# Patient Record
Sex: Female | Born: 1970 | Hispanic: Yes | Marital: Married | Smoking: Current every day smoker
Health system: Southern US, Community
[De-identification: ages and names within clinical notes are randomized; demographics above are authoritative.]

---

## 2007-01-05 ENCOUNTER — Emergency Department: Payer: Self-pay | Admitting: Internal Medicine

## 2007-12-27 ENCOUNTER — Inpatient Hospital Stay: Payer: Self-pay | Admitting: Surgery

## 2007-12-27 ENCOUNTER — Other Ambulatory Visit: Payer: Self-pay

## 2008-06-05 ENCOUNTER — Emergency Department: Payer: Self-pay | Admitting: Internal Medicine

## 2009-10-21 ENCOUNTER — Emergency Department: Payer: Self-pay | Admitting: Emergency Medicine

## 2009-10-22 ENCOUNTER — Emergency Department: Payer: Self-pay | Admitting: Emergency Medicine

## 2010-03-18 ENCOUNTER — Emergency Department: Payer: Self-pay | Admitting: Unknown Physician Specialty

## 2010-09-09 ENCOUNTER — Emergency Department: Payer: Self-pay | Admitting: Emergency Medicine

## 2011-06-13 ENCOUNTER — Emergency Department: Payer: Self-pay | Admitting: Emergency Medicine

## 2018-12-16 ENCOUNTER — Emergency Department: Payer: No Typology Code available for payment source

## 2018-12-16 ENCOUNTER — Emergency Department
Admission: EM | Admit: 2018-12-16 | Discharge: 2018-12-16 | Disposition: A | Discharge status: Home / Self Care | Payer: No Typology Code available for payment source | Attending: Emergency Medicine | Admitting: Emergency Medicine

## 2018-12-16 ENCOUNTER — Other Ambulatory Visit: Payer: Self-pay

## 2018-12-16 DIAGNOSIS — Y9389 Activity, other specified: Secondary | ICD-10-CM | POA: Insufficient documentation

## 2018-12-16 DIAGNOSIS — M25512 Pain in left shoulder: Secondary | ICD-10-CM | POA: Diagnosis not present

## 2018-12-16 DIAGNOSIS — Y92413 State road as the place of occurrence of the external cause: Secondary | ICD-10-CM | POA: Diagnosis not present

## 2018-12-16 DIAGNOSIS — Y998 Other external cause status: Secondary | ICD-10-CM | POA: Insufficient documentation

## 2018-12-16 DIAGNOSIS — S161XXA Strain of muscle, fascia and tendon at neck level, initial encounter: Secondary | ICD-10-CM | POA: Diagnosis not present

## 2018-12-16 DIAGNOSIS — M25572 Pain in left ankle and joints of left foot: Secondary | ICD-10-CM | POA: Diagnosis not present

## 2018-12-16 DIAGNOSIS — R51 Headache: Secondary | ICD-10-CM | POA: Diagnosis not present

## 2018-12-16 DIAGNOSIS — F17210 Nicotine dependence, cigarettes, uncomplicated: Secondary | ICD-10-CM | POA: Diagnosis not present

## 2018-12-16 DIAGNOSIS — M545 Low back pain: Secondary | ICD-10-CM | POA: Insufficient documentation

## 2018-12-16 DIAGNOSIS — S199XXA Unspecified injury of neck, initial encounter: Secondary | ICD-10-CM | POA: Diagnosis present

## 2018-12-16 LAB — URINALYSIS, COMPLETE (UACMP) WITH MICROSCOPIC
Bacteria, UA: NONE SEEN
Bilirubin Urine: NEGATIVE
Glucose, UA: NEGATIVE mg/dL
Hgb urine dipstick: NEGATIVE
Ketones, ur: NEGATIVE mg/dL
Nitrite: NEGATIVE
Protein, ur: NEGATIVE mg/dL
Specific Gravity, Urine: 1.011 (ref 1.005–1.030)
pH: 8 (ref 5.0–8.0)

## 2018-12-16 LAB — COMPREHENSIVE METABOLIC PANEL
ALT: 17 U/L (ref 0–44)
AST: 18 U/L (ref 15–41)
Albumin: 3.9 g/dL (ref 3.5–5.0)
Alkaline Phosphatase: 91 U/L (ref 38–126)
Anion gap: 4 — ABNORMAL LOW (ref 5–15)
BUN: 12 mg/dL (ref 6–20)
CO2: 26 mmol/L (ref 22–32)
Calcium: 8.6 mg/dL — ABNORMAL LOW (ref 8.9–10.3)
Chloride: 108 mmol/L (ref 98–111)
Creatinine, Ser: 0.46 mg/dL (ref 0.44–1.00)
GFR calc Af Amer: >60 mL/min (ref >60)
GFR calc non Af Amer: >60 mL/min (ref >60)
Glucose, Bld: 127 mg/dL — ABNORMAL HIGH (ref 70–99)
Potassium: 3.5 mmol/L (ref 3.5–5.1)
Sodium: 138 mmol/L (ref 135–145)
Total Bilirubin: 0.4 mg/dL (ref 0.3–1.2)
Total Protein: 6.7 g/dL (ref 6.5–8.1)

## 2018-12-16 LAB — CBC
HCT: 41.9 % (ref 36.0–46.0)
Hemoglobin: 14.1 g/dL (ref 12.0–15.0)
MCH: 31.1 pg (ref 26.0–34.0)
MCHC: 33.7 g/dL (ref 30.0–36.0)
MCV: 92.5 fL (ref 80.0–100.0)
Platelets: 252 10*3/uL (ref 150–400)
RBC: 4.53 MIL/uL (ref 3.87–5.11)
RDW: 11.5 % (ref 11.5–15.5)
WBC: 13 10*3/uL — ABNORMAL HIGH (ref 4.0–10.5)
nRBC: 0 % (ref 0.0–0.2)

## 2018-12-16 LAB — POCT PREGNANCY, URINE: Preg Test, Ur: NEGATIVE

## 2018-12-16 LAB — LIPASE, BLOOD: Lipase: 30 U/L (ref 11–51)

## 2018-12-16 MED ORDER — ONDANSETRON HCL 4 MG/2ML IJ SOLN
4.0000 mg | Freq: Once | INTRAMUSCULAR | Status: AC
  Administered 2018-12-16: 4 mg via INTRAVENOUS
  Filled 2018-12-16: qty 2

## 2018-12-16 MED ORDER — ONDANSETRON HCL 4 MG/2ML IJ SOLN
4.0000 mg | Freq: Once | INTRAMUSCULAR | Status: AC
Start: 2018-12-16 — End: 2018-12-16
  Administered 2018-12-16: 20:00:00 4 mg via INTRAVENOUS
  Filled 2018-12-16: qty 2

## 2018-12-16 MED ORDER — MORPHINE SULFATE (PF) 4 MG/ML IV SOLN
4.0000 mg | Freq: Once | INTRAVENOUS | Status: AC
  Administered 2018-12-16: 4 mg via INTRAVENOUS
  Filled 2018-12-16: qty 1

## 2018-12-16 MED ORDER — IOHEXOL 300 MG/ML  SOLN
100.0000 mL | Freq: Once | INTRAMUSCULAR | Status: AC | PRN
  Administered 2018-12-16: 21:00:00 100 mL via INTRAVENOUS

## 2018-12-16 MED ORDER — HYDROMORPHONE HCL 1 MG/ML IJ SOLN
0.5000 mg | INTRAMUSCULAR | Status: AC
  Administered 2018-12-16: 0.5 mg via INTRAVENOUS
  Filled 2018-12-16: qty 1
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
## 2018-12-16 NOTE — ED Notes (Signed)
Pt's daughters Geraldo Pitter and Janett Billow, as well as her friend Verdis Frederickson was called and updated by the RN and the pt.
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
## 2018-12-16 NOTE — ED Notes (Signed)
Pt to CT with tech.

## 2020-04-03 ENCOUNTER — Ambulatory Visit: Payer: Self-pay | Attending: Internal Medicine

## 2020-04-03 DIAGNOSIS — Z23 Encounter for immunization: Secondary | ICD-10-CM

## 2020-04-03 NOTE — Progress Notes (Signed)
   Covid-19 Vaccination Clinic  Name:  Nima Bamburg    MRN: [id number] DOB: May 17, 1971  04/03/2020  Ms. Brockel was observed post Covid-19 immunization for 15 minutes without incident. She was provided with Vaccine Information Sheet and instruction to access the V-Safe system.   Ms. Blitzer was instructed to call 911 with any severe reactions post vaccine: Marland Kitchen Difficulty breathing  . Swelling of face and throat  . A fast heartbeat  . A bad rash all over body  . Dizziness and weakness   Immunizations Administered    Name Date Dose VIS Date Route   Moderna COVID-19 Vaccine 04/03/2020  3:56 PM 0.5 mL 03/22/2020 Intramuscular   Manufacturer: Moderna   Lot: [id number]   NDC: 09381-829-93
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
## 2020-05-01 ENCOUNTER — Ambulatory Visit: Payer: No Typology Code available for payment source | Attending: Internal Medicine

## 2020-05-01 DIAGNOSIS — Z23 Encounter for immunization: Secondary | ICD-10-CM

## 2020-05-01 NOTE — Progress Notes (Signed)
° °  Covid-19 Vaccination Clinic  Name:  Becky Massey    MRN: [id number] DOB: 1971/05/15  05/01/2020  Ms. Farinas was observed post Covid-19 immunization for 15 minutes without incident. She was provided with Vaccine Information Sheet and instruction to access the V-Safe system.   Ms. Perusse was instructed to call 911 with any severe reactions post vaccine:  Difficulty breathing   Swelling of face and throat   A fast heartbeat   A bad rash all over body   Dizziness and weakness   Immunizations Administered    Name Date Dose VIS Date Route   Moderna COVID-19 Vaccine 05/01/2020  4:06 PM 0.5 mL 03/22/2020 Intramuscular   Manufacturer: Gala Murdoch   Lot: [id number]   NDC: 76734-193-79
